# Patient Record
Sex: Male | Born: 1980
Health system: Southern US, Community
[De-identification: ages and names within clinical notes are randomized; demographics above are authoritative.]

---

## 2004-03-14 ENCOUNTER — Emergency Department (HOSPITAL_COMMUNITY): Admission: EM | Admit: 2004-03-14 | Discharge: 2004-03-14 | Payer: Self-pay

## 2004-12-21 ENCOUNTER — Emergency Department (HOSPITAL_COMMUNITY): Admission: EM | Admit: 2004-12-21 | Discharge: 2004-12-21 | Payer: Self-pay | Admitting: Emergency Medicine

## 2005-09-16 ENCOUNTER — Emergency Department (HOSPITAL_COMMUNITY): Admission: EM | Admit: 2005-09-16 | Discharge: 2005-09-16 | Payer: Self-pay | Admitting: Emergency Medicine

## 2006-07-02 IMAGING — CR DG CHEST 2V
2 series · 2 of 2 positions shown · non-contrast
Comparison: None.

CLINICAL DATA: Cough.  Fever.  Flu-like symptoms.
 CHEST ? 2 VIEW:

[w chest pa]
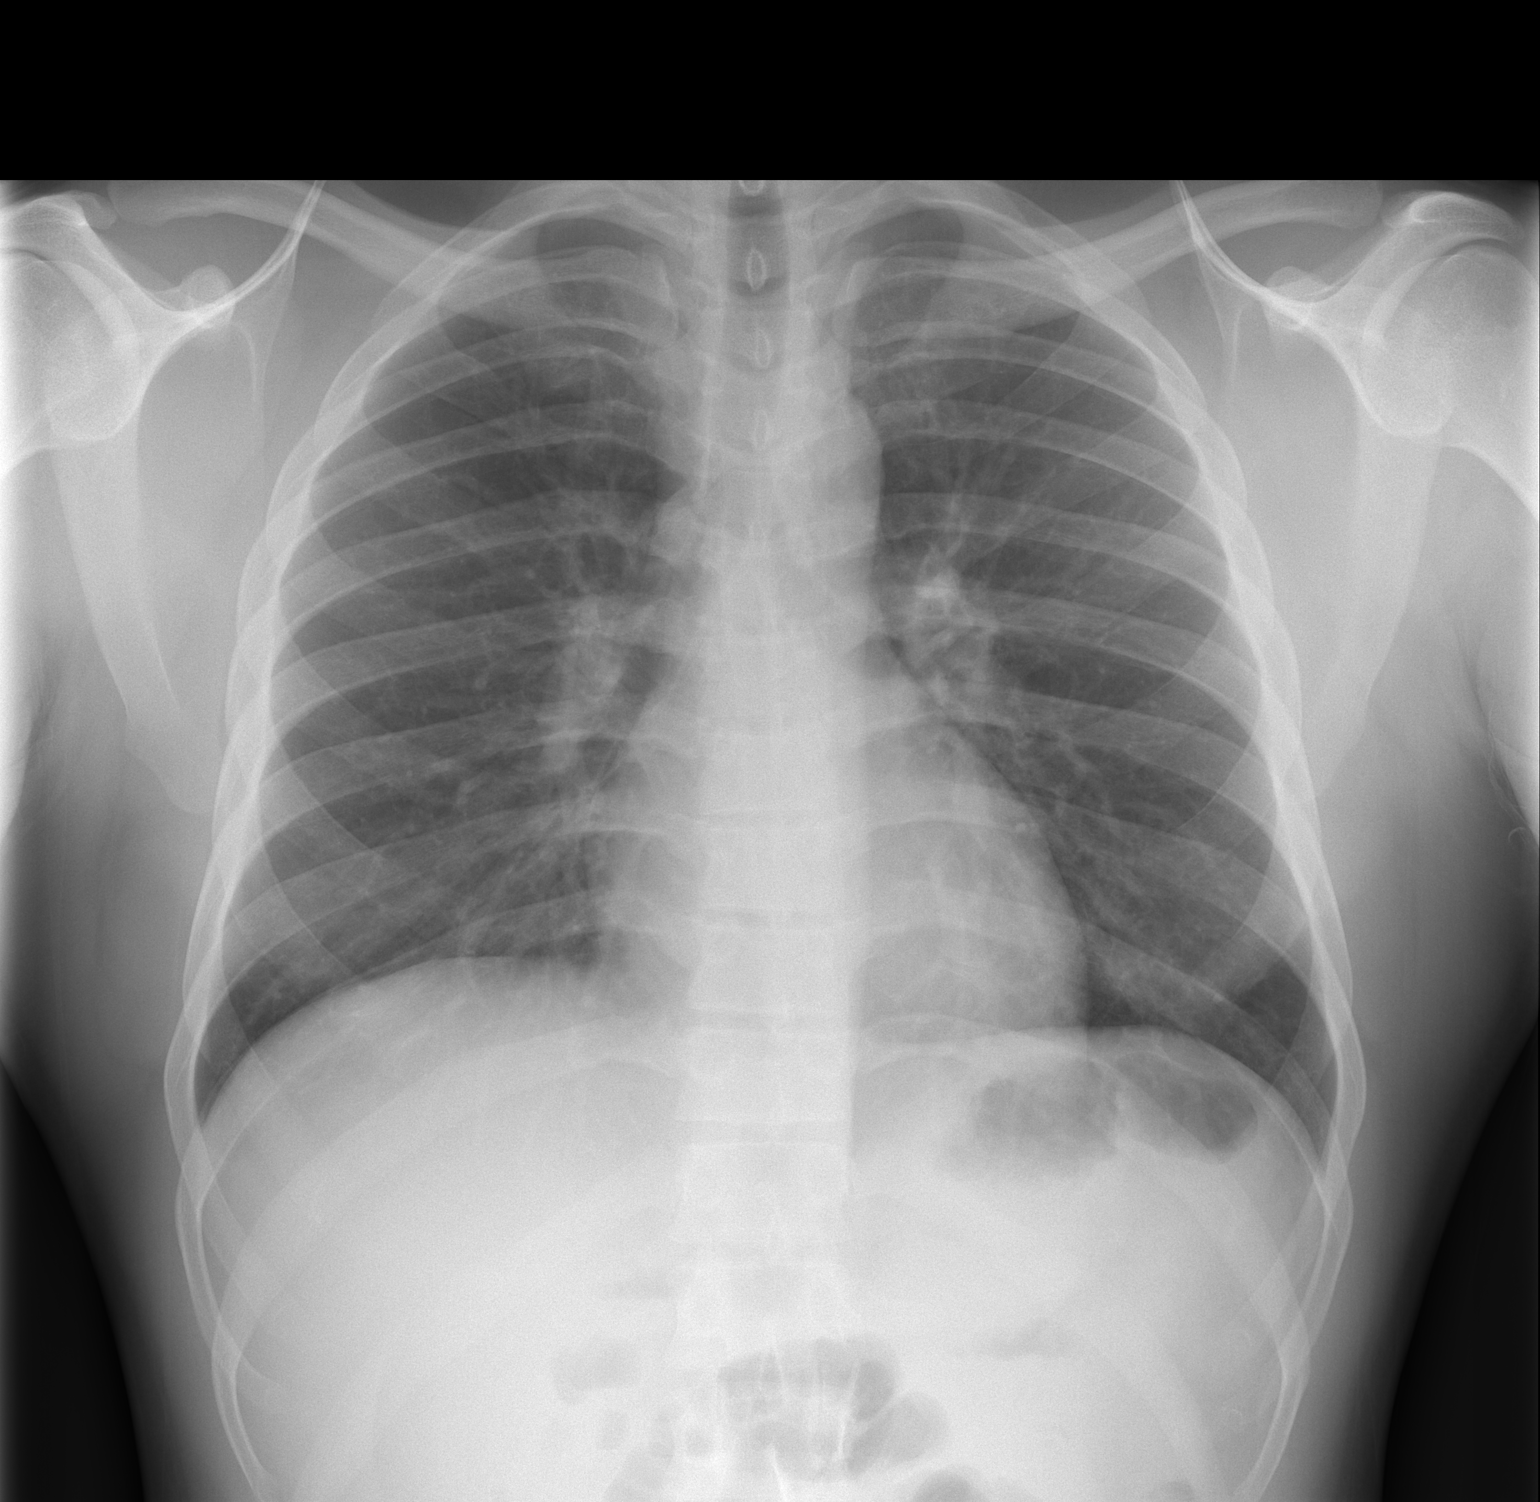

[w chest lat]
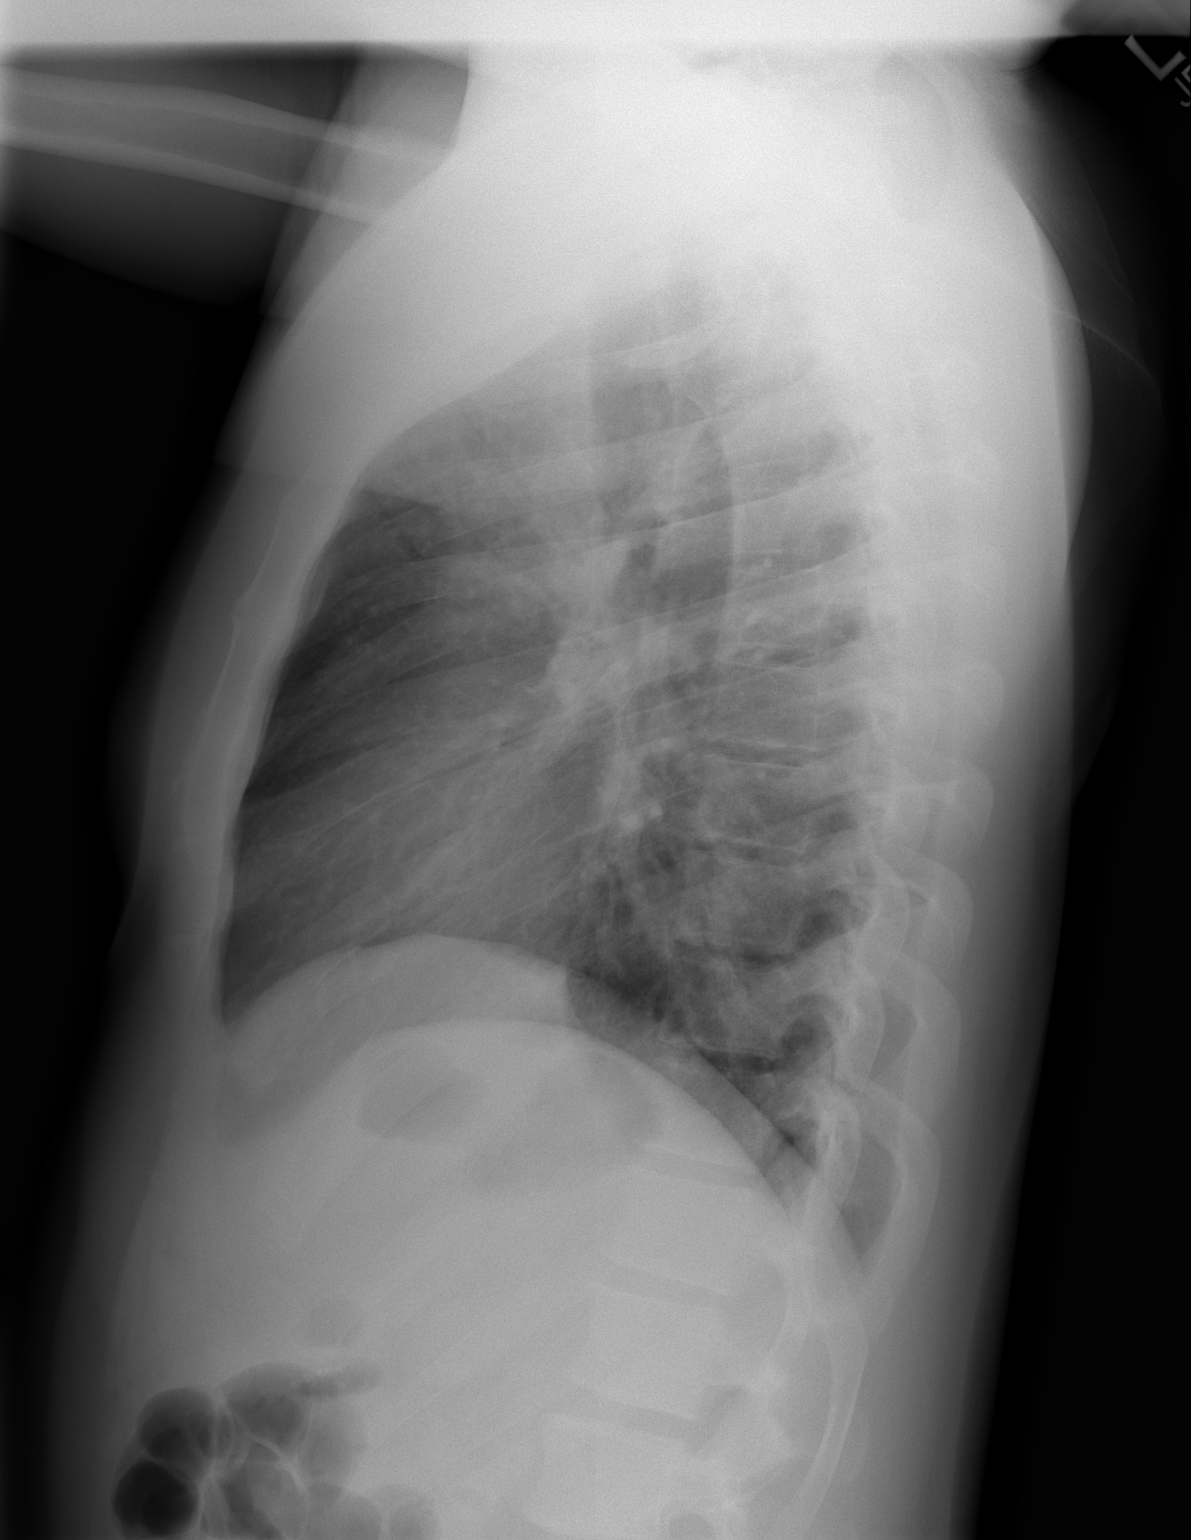

[2 of 2 positions shown; findings below may reference images not displayed]

FINDINGS: Mild patchy density is seen at the left base compatible with early airspace disease versus atelectasis.  Lungs are otherwise clear.  Mild central bronchitic changes are seen.  The heart is normal in size.  No pneumothoraces or effusions are seen.
IMPRESSION: Bronchitic changes.  Mild patchy atelectasis versus airspace disease left base.

## 2007-04-05 ENCOUNTER — Emergency Department (HOSPITAL_COMMUNITY): Admission: EM | Admit: 2007-04-05 | Discharge: 2007-04-05 | Payer: Self-pay | Admitting: Emergency Medicine

## 2007-06-17 ENCOUNTER — Emergency Department (HOSPITAL_COMMUNITY): Admission: EM | Admit: 2007-06-17 | Discharge: 2007-06-17 | Payer: Self-pay | Admitting: Family Medicine

## 2008-07-15 ENCOUNTER — Emergency Department (HOSPITAL_COMMUNITY): Admission: EM | Admit: 2008-07-15 | Discharge: 2008-07-15 | Payer: Self-pay | Admitting: Emergency Medicine

## 2009-01-11 ENCOUNTER — Emergency Department (HOSPITAL_COMMUNITY): Admission: EM | Admit: 2009-01-11 | Discharge: 2009-01-11 | Payer: Self-pay | Admitting: Emergency Medicine

## 2009-04-30 IMAGING — CR DG CHEST 2V
2 series · 2 of 2 positions shown · non-contrast
Comparison: PA and lateral chest 09/16/2005.

CLINICAL DATA: Sore throat and cough.

CHEST - 2 VIEW

[w chest pa]
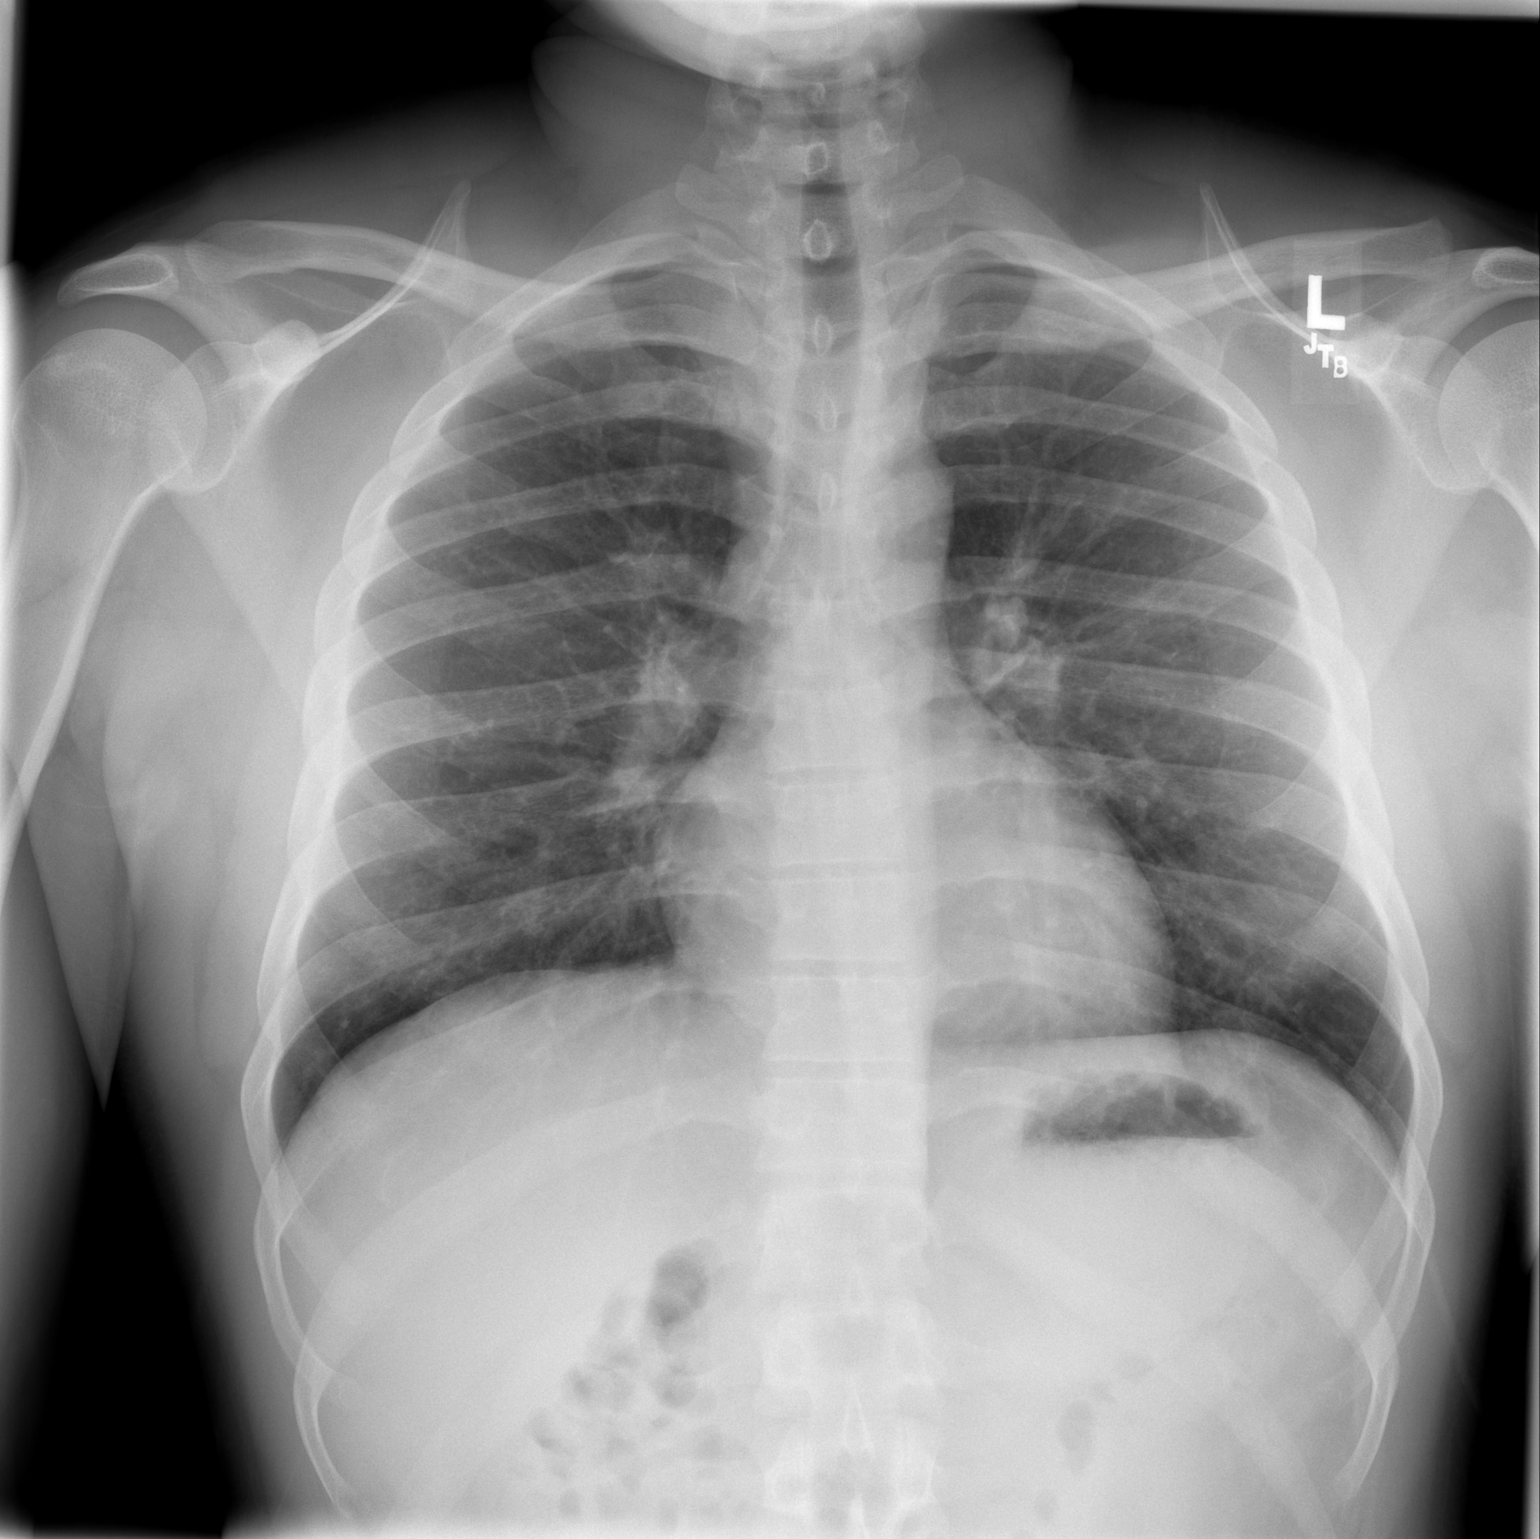

[w chest lat]
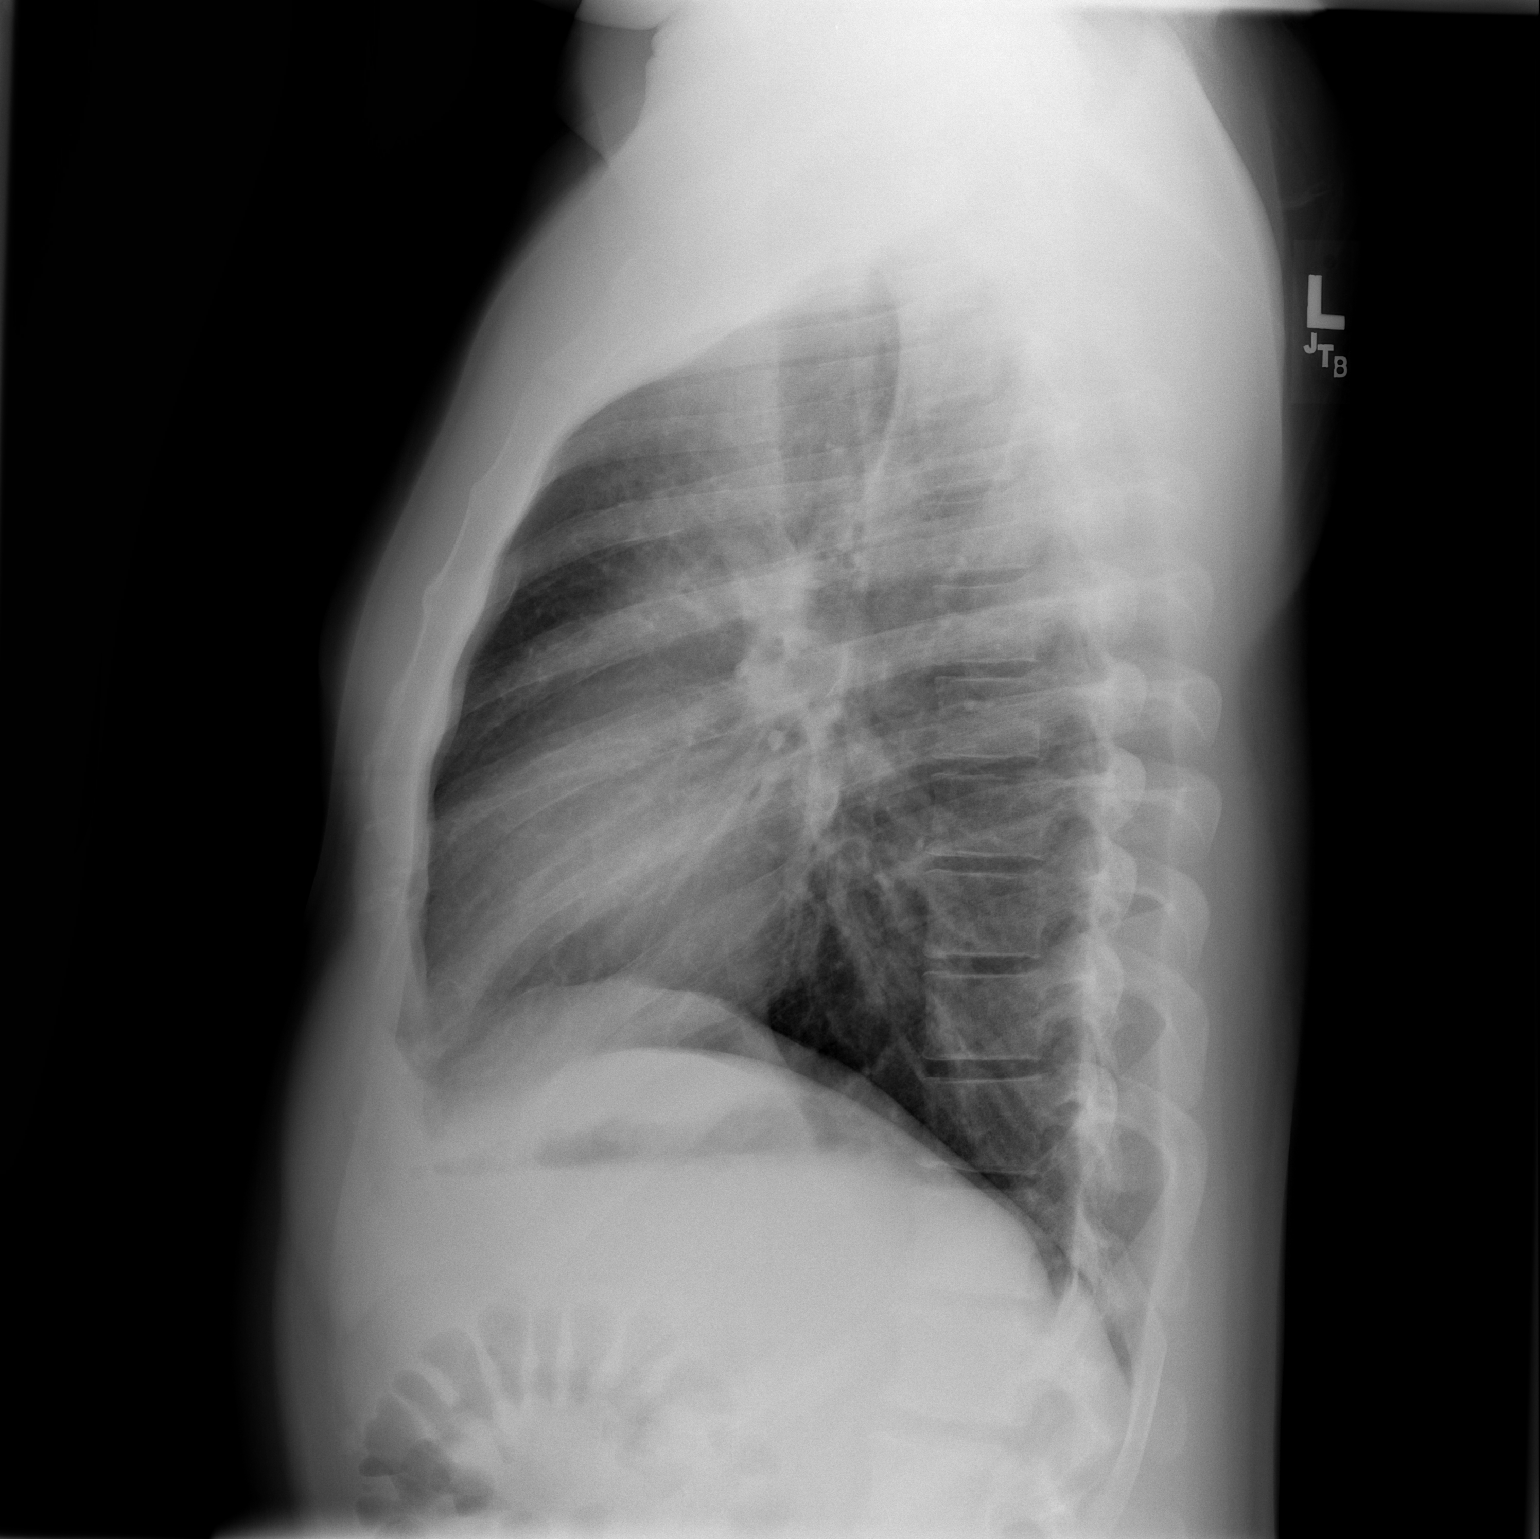

[2 of 2 positions shown; findings below may reference images not displayed]

FINDINGS: The lungs are clear.  Heart size is normal.  No pleural
effusion or focal bony abnormality.
IMPRESSION: No acute disease.

## 2013-06-08 ENCOUNTER — Emergency Department (INDEPENDENT_AMBULATORY_CARE_PROVIDER_SITE_OTHER)
Admission: EM | Admit: 2013-06-08 | Discharge: 2013-06-08 | Disposition: A | Discharge status: Home / Self Care | Payer: Self-pay | Source: Home / Self Care | Attending: Family Medicine | Admitting: Family Medicine

## 2013-06-08 ENCOUNTER — Encounter (HOSPITAL_COMMUNITY): Payer: Self-pay | Admitting: Emergency Medicine

## 2013-06-08 DIAGNOSIS — J069 Acute upper respiratory infection, unspecified: Secondary | ICD-10-CM

## 2013-06-08 MED ORDER — AZITHROMYCIN 250 MG PO TABS: 250.0000 mg | ORAL_TABLET | Freq: Every day | ORAL | Status: DC

## 2013-06-08 MED ORDER — IPRATROPIUM BROMIDE 0.03 % NA SOLN: 2.0000 | Freq: Two times a day (BID) | NASAL | Status: DC

## 2013-06-08 MED ORDER — HYDROCODONE-ACETAMINOPHEN 5-325 MG PO TABS: 0.5000 | ORAL_TABLET | Freq: Every evening | ORAL | Status: DC | PRN

## 2013-06-08 NOTE — ED Provider Notes (Signed)
Roberto Hunt. Ticas is a 32 y.o. male who presents to Urgent Care today for sore throat nasal discharge cough and congestion present for 2 days. Patient has tried some over-the-counter medications which have not helped much. No nausea vomiting or diarrhea chest pains palpitations or trouble breathing. He feels well otherwise.   History reviewed. No pertinent past medical history. History  Substance Use Topics  . Smoking status: Current Every Day Smoker  . Smokeless tobacco: Not on file  . Alcohol Use: Yes   ROS as above Medications reviewed. No current facility-administered medications for this encounter.   Current Outpatient Prescriptions  Medication Sig Dispense Refill  . azithromycin (ZITHROMAX) 250 MG tablet Take 1 tablet (250 mg total) by mouth daily. Take first 2 tablets together, then 1 every day until finished.  6 tablet  0  . HYDROcodone-acetaminophen (NORCO/VICODIN) 5-325 MG per tablet Take 0.5 tablets by mouth at bedtime as needed for pain (cough).  6 tablet  0  . ipratropium (ATROVENT) 0.03 % nasal spray Place 2 sprays into the nose every 12 (twelve) hours.  30 mL  1    Exam:  BP 140/83  Pulse 73  Temp(Src) 98.2 F (36.8 C) (Oral)  Resp 18  SpO2 100% Gen: Well NAD HEENT: EOMI,  MMM, clear nasal discharge. Tympanic membranes are normal appearing bilaterally. Posterior pharynx with cobblestoning. Lungs: CTABL Nl WOB Heart: RRR no MRG Abd: NABS, NT, ND Exts: Non edematous BL  LE, warm and well perfused.   Results for orders placed during the hospital encounter of 06/08/13 (from the past 24 hour(s))  POCT RAPID STREP A ([facility] ONLY)     Status: None   Collection Time    06/08/13  8:42 AM      Result Value Range   Streptococcus, Group A Screen (Direct) NEGATIVE  NEGATIVE   No results found.  Assessment and Plan: 32 y.o. male with viral URI with cough. Additionally patient has some postnasal drip. Plan to treat symptomatically with over-the-counter pain  medications. Additionally use Norco for cough, and Atrovent nasal spray for postnasal drip. Additional prescribe azithromycin if patient does not improve in several days. Followup as needed. Work note provided. Discussed warning signs or symptoms. Please see discharge instructions. Patient expresses understanding.      Rodolph Bong, MD 06/08/13 [phone]

## 2013-06-08 NOTE — ED Notes (Signed)
Pt  Reports  Symptoms  Of  Cough  /  Congested           Runny  Nose  And  A   sorethroat  With  The  Onset   Of  Symptoms  4  Days  Ago

## 2013-06-10 LAB — CULTURE, GROUP A STREP

## 2013-06-11 NOTE — ED Notes (Signed)
Lab review

## 2013-12-22 ENCOUNTER — Encounter (HOSPITAL_COMMUNITY): Payer: Self-pay | Admitting: Emergency Medicine

## 2013-12-22 ENCOUNTER — Emergency Department (HOSPITAL_COMMUNITY)
Admission: EM | Admit: 2013-12-22 | Discharge: 2013-12-23 | Disposition: A | Discharge status: Home / Self Care | Payer: BC Managed Care – PPO | Attending: Emergency Medicine | Admitting: Emergency Medicine

## 2013-12-22 DIAGNOSIS — R42 Dizziness and giddiness: Secondary | ICD-10-CM | POA: Insufficient documentation

## 2013-12-22 DIAGNOSIS — F17200 Nicotine dependence, unspecified, uncomplicated: Secondary | ICD-10-CM | POA: Insufficient documentation

## 2013-12-22 DIAGNOSIS — Z79899 Other long term (current) drug therapy: Secondary | ICD-10-CM | POA: Insufficient documentation

## 2013-12-22 DIAGNOSIS — R55 Syncope and collapse: Secondary | ICD-10-CM

## 2013-12-22 LAB — I-STAT CHEM 8, ED
BUN: 14 mg/dL (ref 6–23)
CALCIUM ION: 1.18 mmol/L (ref 1.12–1.23)
CHLORIDE: 102 meq/L (ref 96–112)
Creatinine, Ser: 1.1 mg/dL (ref 0.50–1.35)
GLUCOSE: 88 mg/dL (ref 70–99)
HEMATOCRIT: 46 % (ref 39.0–52.0)
Hemoglobin: 15.6 g/dL (ref 13.0–17.0)
POTASSIUM: 3.6 meq/L — AB (ref 3.7–5.3)
SODIUM: 140 meq/L (ref 137–147)
TCO2: 27 mmol/L (ref 0–100)

## 2013-12-22 LAB — CBC WITH DIFFERENTIAL/PLATELET
BASOS ABS: 0 10*3/uL (ref 0.0–0.1)
Basophils Relative: 0 % (ref 0–1)
EOS PCT: 2 % (ref 0–5)
Eosinophils Absolute: 0.2 10*3/uL (ref 0.0–0.7)
HCT: 43.1 % (ref 39.0–52.0)
HEMOGLOBIN: 14.1 g/dL (ref 13.0–17.0)
LYMPHS PCT: 22 % (ref 12–46)
Lymphs Abs: 2 10*3/uL (ref 0.7–4.0)
MCH: 28.3 pg (ref 26.0–34.0)
MCHC: 32.7 g/dL (ref 30.0–36.0)
MCV: 86.5 fL (ref 78.0–100.0)
MONO ABS: 0.9 10*3/uL (ref 0.1–1.0)
MONOS PCT: 11 % (ref 3–12)
NEUTROS ABS: 5.8 10*3/uL (ref 1.7–7.7)
Neutrophils Relative %: 65 % (ref 43–77)
Platelets: 218 10*3/uL (ref 150–400)
RBC: 4.98 MIL/uL (ref 4.22–5.81)
RDW: 13 % (ref 11.5–15.5)
WBC: 9 10*3/uL (ref 4.0–10.5)

## 2013-12-22 MED ORDER — SODIUM CHLORIDE 0.9 % IV BOLUS (SEPSIS)
1000.0000 mL | Freq: Once | INTRAVENOUS | Status: AC
  Administered 2013-12-22: 1000 mL via INTRAVENOUS

## 2013-12-22 NOTE — ED Notes (Signed)
Bed: WA22 Expected date:  Expected time:  Means of arrival:  Comments: EMS-syncopal episode 

## 2013-12-22 NOTE — ED Provider Notes (Signed)
Patient had syncopal event tonight 8:45 PM approximately 2.5 hours after having donated plasma. He felt lightheaded immediately prior to syncopal event. He suffered loss of con/proximally 45 seconds according to his wife. He is asymptomatic since treatment in the emergency department with intravenous fluids. On exam no distress lungs clear auscultation heart regular rate and rhythm murmurs. He is not lightheaded on standing gait normal at 11:50 PM patient feels well and ready to go home.  Date: 12/22/2013  Rate: 70  Rhythm: normal sinus rhythm  QRS Axis: normal  Intervals: normal  ST/T Wave abnormalities: normal  Conduction Disutrbances: none  Narrative Interpretation: unremarkable Spent 3 minutes councilling pt on smoking cessation   Syncope likely  Due to hypovolemia  Doug SouSam Sabree Nuon, MD 12/23/13 0000

## 2013-12-22 NOTE — ED Provider Notes (Signed)
CSN: [id number]     Arrival date & time 12/22/13  2154 History   First MD Initiated Contact with Patient 12/22/13 2217     Chief Complaint  Patient presents with  . Loss of Consciousness     (Consider location/radiation/quality/duration/timing/severity/associated sxs/prior Treatment) HPI Rhona RaiderJacob J. Manson PasseyBrown is a 33 y.o. male who presents to ED with complaint of syncopal episode. Pt states he donated plasma this morning. States ate two hamburgers, however has not had much fluids since then. States was sitting down for dinner when suddenly became light headded, dizzy, states tried to get up to go lie down but had a syncopal episode. It was witnessed. No injuries reported. Syncopal episode lasted few seconds. No followed confusion, bladder incontinence. Pt states when he came to it, he was still dizzy and when tried to get up, got light headed again and felt like he was going to faint again. Pt was then moved to the couch. EMS was called and he was brought here. Pt denies any current complaints. He denies ever having any chest pain. Denies any headache. No palpitations. No cardiac history.   History reviewed. No pertinent past medical history. History reviewed. No pertinent past surgical history. No family history on file. History  Substance Use Topics  . Smoking status: Current Every Day Smoker  . Smokeless tobacco: Not on file  . Alcohol Use: Yes    Review of Systems  Constitutional: Negative for fever and chills.  HENT: Negative for congestion.   Eyes: Negative for photophobia and visual disturbance.  Respiratory: Negative for cough, chest tightness and shortness of breath.   Cardiovascular: Negative for chest pain, palpitations and leg swelling.  Gastrointestinal: Negative for nausea, vomiting, abdominal pain, diarrhea and abdominal distention.  Genitourinary: Negative for dysuria, urgency, frequency and hematuria.  Musculoskeletal: Negative for arthralgias, myalgias, neck pain and neck  stiffness.  Skin: Negative for rash.  Allergic/Immunologic: Negative for immunocompromised state.  Neurological: Positive for dizziness, syncope and light-headedness. Negative for seizures, speech difficulty, weakness, numbness and headaches.      Allergies  Review of patient's allergies indicates no known allergies.  Home Medications   Prior to Admission medications   Medication Sig Start Date End Date Taking? Authorizing Provider  azithromycin (ZITHROMAX) 250 MG tablet Take 1 tablet (250 mg total) by mouth daily. Take first 2 tablets together, then 1 every day until finished. 06/08/13   Rodolph BongEvan S Corey, MD  HYDROcodone-acetaminophen (NORCO/VICODIN) 5-325 MG per tablet Take 0.5 tablets by mouth at bedtime as needed for pain (cough). 06/08/13   Rodolph BongEvan S Corey, MD  ipratropium (ATROVENT) 0.03 % nasal spray Place 2 sprays into the nose every 12 (twelve) hours. 06/08/13   Rodolph BongEvan S Corey, MD   BP 116/72  Pulse 87  Temp(Src) 98.1 F (36.7 C) (Oral)  Resp 16  SpO2 99% Physical Exam  Nursing note and vitals reviewed. Constitutional: He appears well-developed and well-nourished. No distress.  HENT:  Head: Normocephalic and atraumatic.  Eyes: Conjunctivae are normal.  Neck: Neck supple.  Cardiovascular: Normal rate, regular rhythm and normal heart sounds.   Pulmonary/Chest: Effort normal. No respiratory distress. He has no wheezes. He has no rales.  Abdominal: Soft. Bowel sounds are normal. He exhibits no distension. There is no tenderness. There is no rebound.  Musculoskeletal: He exhibits no edema.  Neurological: He is alert.  5/5 and equal upper and lower extremity strength bilaterally. Equal grip strength bilaterally. Normal finger to nose and heel to shin. No pronator drift. Normal gait  Skin: Skin is warm and dry.    ED Course  Procedures (including critical care time) Labs Review Labs Reviewed  I-STAT CHEM 8, ED - Abnormal; Notable for the following:    Potassium 3.6 (*)    All  other components within normal limits  CBC WITH DIFFERENTIAL    Imaging Review No results found.   EKG Interpretation None      MDM   Final diagnoses:  Syncope    Pt with LOC tonight after becoming dizzy sitting up at the table and trying to get up. Pt has mild elevation in HR upon standing here in ED, BP remains stable. He already received 1L of NS. His VS are normal. He denies any complaints. He is asymptomatic. I suspect his syncope most likely due to hypovolemia. He is otherwise healthy with no cardiac problems. Will get CBC, electrolytes, and administer more IV fluids.   12:10 AM Labs uremarkable. Pt also seen by Dr. Ethelda ChickJacubowitz. Agrees with my assessment. Pt feeling normal, no complaints. ECG normal. Pt is able to ambulate here in ED with no dizziness. Home with fluids PO, follow up with PCP.   Filed Vitals:   12/22/13 2242 12/22/13 2245 12/22/13 2252 12/22/13 2357  BP: 107/57 102/69 116/72 117/78  Pulse: 65 73 87 70  Temp:      TempSrc:      Resp:    20  SpO2:    100%         Abdulrahman Bracey A Emira Eubanks, PA-C 12/23/13 0019

## 2013-12-22 NOTE — ED Notes (Signed)
Per EMS, pt had lost consciousness for ~30 seconds while he was eating dinner at 9PM. Pt states he gave plasma at 7PM today for the first time since October. Pt states he had not eaten or had anything to drink since giving plasma today. Pt A&Ox4

## 2013-12-23 NOTE — ED Provider Notes (Signed)
Medical screening examination/treatment/procedure(s) were performed by non-physician practitioner and as supervising physician I was immediately available for consultation/collaboration.   EKG Interpretation None        Lyanne CoKevin M Danel Studzinski, MD 12/23/13 [phone]

## 2013-12-23 NOTE — Discharge Instructions (Signed)
Make sure to drink plenty of fluids. Eat well. Follow up with your doctor. Stop smoking. Return if worsening.    Syncope Syncope is a fainting spell. This means the person loses consciousness and drops to the ground. The person is generally unconscious for less than 5 minutes. The person may have some muscle twitches for up to 15 seconds before waking up and returning to normal. Syncope occurs more often in elderly people, but it can happen to anyone. While most causes of syncope are not dangerous, syncope can be a sign of a serious medical problem. It is important to seek medical care.  CAUSES  Syncope is caused by a sudden decrease in blood flow to the brain. The specific cause is often not determined. Factors that can trigger syncope include:  Taking medicines that lower blood pressure.  Sudden changes in posture, such as standing up suddenly.  Taking more medicine than prescribed.  Standing in one place for too long.  Seizure disorders.  Dehydration and excessive exposure to heat.  Low blood sugar (hypoglycemia).  Straining to have a bowel movement.  Heart disease, irregular heartbeat, or other circulatory problems.  Fear, emotional distress, seeing blood, or severe pain. SYMPTOMS  Right before fainting, you may:  Feel dizzy or lightheaded.  Feel nauseous.  See all white or all black in your field of vision.  Have cold, clammy skin. DIAGNOSIS  Your caregiver will ask about your symptoms, perform a physical exam, and perform electrocardiography (ECG) to record the electrical activity of your heart. Your caregiver may also perform other heart or blood tests to determine the cause of your syncope. TREATMENT  In most cases, no treatment is needed. Depending on the cause of your syncope, your caregiver may recommend changing or stopping some of your medicines. HOME CARE INSTRUCTIONS  Have someone stay with you until you feel stable.  Do not drive, operate machinery, or  play sports until your caregiver says it is okay.  Keep all follow-up appointments as directed by your caregiver.  Lie down right away if you start feeling like you might faint. Breathe deeply and steadily. Wait until all the symptoms have passed.  Drink enough fluids to keep your urine clear or pale yellow.  If you are taking blood pressure or heart medicine, get up slowly, taking several minutes to sit and then stand. This can reduce dizziness. SEEK IMMEDIATE MEDICAL CARE IF:   You have a severe headache.  You have unusual pain in the chest, abdomen, or back.  You are bleeding from the mouth or rectum, or you have black or tarry stool.  You have an irregular or very fast heartbeat.  You have pain with breathing.  You have repeated fainting or seizure-like jerking during an episode.  You faint when sitting or lying down.  You have confusion.  You have difficulty walking.  You have severe weakness.  You have vision problems. If you fainted, call your local emergency services (911 in [place] Do not drive yourself to the hospital.  MAKE SURE YOU:  Understand these instructions.  Will watch your condition.  Will get help right away if you are not doing well or get worse. Document Released: 07/29/2005 Document Revised: 01/28/2012 Document Reviewed: 09/27/2011 [facility] Patient Information 2014 [employer], [place].

## 2019-03-17 ENCOUNTER — Other Ambulatory Visit: Payer: Self-pay | Admitting: Family Medicine

## 2019-03-17 DIAGNOSIS — Z20822 Contact with and (suspected) exposure to covid-19: Secondary | ICD-10-CM

## 2019-03-18 ENCOUNTER — Other Ambulatory Visit: Payer: Self-pay

## 2019-03-18 DIAGNOSIS — Z20822 Contact with and (suspected) exposure to covid-19: Secondary | ICD-10-CM

## 2019-03-19 LAB — SPECIMEN STATUS REPORT

## 2019-03-19 LAB — NOVEL CORONAVIRUS, NAA: SARS-CoV-2, NAA: NOT DETECTED

## 2019-07-09 ENCOUNTER — Encounter: Payer: Self-pay | Admitting: Emergency Medicine

## 2019-07-09 ENCOUNTER — Other Ambulatory Visit: Payer: Self-pay

## 2019-07-09 ENCOUNTER — Ambulatory Visit
Admission: EM | Admit: 2019-07-09 | Discharge: 2019-07-09 | Disposition: A | Discharge status: Home / Self Care | Payer: 59 | Attending: Emergency Medicine | Admitting: Emergency Medicine

## 2019-07-09 DIAGNOSIS — Z20828 Contact with and (suspected) exposure to other viral communicable diseases: Secondary | ICD-10-CM

## 2019-07-09 DIAGNOSIS — R5383 Other fatigue: Secondary | ICD-10-CM

## 2019-07-09 NOTE — Discharge Instructions (Addendum)
Your COVID test is pending - it is important to quarantine / isolate at home until your results are back. °If you test positive and would like further evaluation for persistent or worsening symptoms, you may schedule an E-visit or virtual (video) visit throughout the [facility] MyChart app or website. ° °PLEASE NOTE: If you develop severe chest pain or shortness of breath please go to the ER or call 9-1-1 for further evaluation --> DO NOT schedule electronic or virtual visits for this. °Please call our office for further guidance / recommendations as needed. °

## 2019-07-09 NOTE — ED Provider Notes (Signed)
[name]    CSN: [id number] Arrival date & time: 07/09/19  4166      History   Chief Complaint Chief Complaint  Patient presents with  . Fatigue    HPI Roberto Hunt is a 38 y.o. male with history of allergies presenting for fatigue, intermittent fever since this morning.  Patient denies myalgias, known sick contacts.  Has not taken anything for his symptoms.  States his temperature has varied between 101.107F, 90 9.72F at home.  Has not taken allergy medications the last 3 days.  Patient denies history of anemia, cardiopulmonary disease.  No other associated symptoms and reports good oral intake.   History reviewed. No pertinent past medical history.  There are no active problems to display for this patient.   History reviewed. No pertinent surgical history.     Home Medications    Prior to Admission medications   Medication Sig Start Date End Date Taking? Authorizing Provider  montelukast (SINGULAIR) 10 MG tablet Take 10 mg by mouth at bedtime.   Yes [provider]  OVER THE COUNTER MEDICATION Take 1 tablet by mouth once. [employer] brand supplement for energy    [provider]    Family History Family History  Problem Relation Age of Onset  . Healthy Mother   . Hypertension Father     Social History Social History   Tobacco Use  . Smoking status: Current Every Day Smoker    Packs/day: 0.10  . Smokeless tobacco: Never Used  Substance Use Topics  . Alcohol use: Yes  . Drug use: Never     Allergies   Patient has no known allergies.   Review of Systems Review of Systems  Constitutional: Positive for fatigue and fever.  Respiratory: Negative for cough and shortness of breath.   Cardiovascular: Negative for chest pain and palpitations.  Gastrointestinal: Negative for abdominal pain, diarrhea and vomiting.  Musculoskeletal: Negative for arthralgias and myalgias.  Skin: Negative for rash and wound.  Neurological: Negative  for speech difficulty and headaches.  All other systems reviewed and are negative.    Physical Exam Triage Vital Signs ED Triage Vitals  Enc Vitals Group     BP      Pulse      Resp      Temp      Temp src      SpO2      Weight      Height      Head Circumference      Peak Flow      Pain Score      Pain Loc      Pain Edu?      Excl. in [place]?    No data found.  Updated Vital Signs BP (!) 149/101 (BP Location: Left Arm)   Pulse 96   Temp 99.5 F (37.5 C) (Temporal)   Resp 18   SpO2 97%   Visual Acuity Right Eye Distance:   Left Eye Distance:   Bilateral Distance:    Right Eye Near:   Left Eye Near:    Bilateral Near:     Physical Exam Constitutional:      General: He is not in acute distress.    Appearance: He is obese. He is not ill-appearing.  HENT:     Head: Normocephalic and atraumatic.     Mouth/Throat:     Mouth: Mucous membranes are moist.     Pharynx: Oropharynx is clear.  Eyes:  General: No scleral icterus.    Pupils: Pupils are equal, round, and reactive to light.  Cardiovascular:     Rate and Rhythm: Normal rate and regular rhythm.  Pulmonary:     Effort: Pulmonary effort is normal. No respiratory distress.     Breath sounds: No wheezing.  Musculoskeletal: Normal range of motion.        General: No tenderness.     Right lower leg: No edema.     Left lower leg: No edema.  Skin:    Capillary Refill: Capillary refill takes less than 2 seconds.     Coloration: Skin is not jaundiced or pale.  Neurological:     General: No focal deficit present.     Mental Status: He is alert and oriented to person, place, and time.      UC Treatments / Results  Labs (all labs ordered are listed, but only abnormal results are displayed) Labs Reviewed  NOVEL CORONAVIRUS, NAA    EKG   Radiology No results found.  Procedures Procedures (including critical care time)  Medications Ordered in UC Medications - No data to display  Initial  Impression / Assessment and Plan / UC Course  I have reviewed the triage vital signs and the nursing notes.  Pertinent labs & imaging results that were available during my care of the patient were reviewed by me and considered in my medical decision making (see chart for details).     Covid PCR pending at patient's request.  Patient without significant risk factors, no reported blood loss/active bleeding or orthostasis.  Patient afebrile, nontoxic.  Will quarantine until results are back, treat supportively as reviewed at time of appointment.  Return precautions discussed, patient verbalized understanding and is agreeable to plan. Final Clinical Impressions(s) / UC Diagnoses   Final diagnoses:  Fatigue, unspecified type     Discharge Instructions     Your COVID test is pending - it is important to quarantine / isolate at home until your results are back. If you test positive and would like further evaluation for persistent or worsening symptoms, you may schedule an E-visit or virtual (video) visit throughout the [facility] app or website.  PLEASE NOTE: If you develop severe chest pain or shortness of breath please go to the ER or call 9-1-1 for further evaluation --> DO NOT schedule electronic or virtual visits for this. Please call our office for further guidance / recommendations as needed.    ED Prescriptions    None     PDMP not reviewed this encounter.   [place], [place], [place] 07/09/19 1124

## 2019-07-09 NOTE — ED Triage Notes (Signed)
Pt presents to [facility] for assessment after waking up to fatigue this morning.  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  Denies any symptoms other than fatigue and fever.  99.5 temporal here I office.

## 2019-07-09 NOTE — ED Notes (Signed)
Patient able to ambulate independently  

## 2019-07-11 LAB — NOVEL CORONAVIRUS, NAA: SARS-CoV-2, NAA: DETECTED — AB

## 2019-07-12 ENCOUNTER — Telehealth (HOSPITAL_COMMUNITY): Payer: Self-pay | Admitting: Emergency Medicine

## 2019-07-12 NOTE — Telephone Encounter (Signed)
Positive covid detected on sample Patient contacted by phone and made aware of    Results. Pt quarantine time will end on dec 8th as long as no fever and symptoms improved. Work notes sent to [employer]. Pt states his family is getting tested, pt instructed that they may still need to stay quarantined due to long incubation period of covid. Pt verbalized understanding and had all questions answered.

## 2020-08-07 ENCOUNTER — Other Ambulatory Visit: Payer: Self-pay

## 2020-08-07 ENCOUNTER — Ambulatory Visit
Admission: EM | Admit: 2020-08-07 | Discharge: 2020-08-07 | Disposition: A | Discharge status: Home / Self Care | Payer: 59 | Attending: Emergency Medicine | Admitting: Emergency Medicine

## 2020-08-07 DIAGNOSIS — R197 Diarrhea, unspecified: Secondary | ICD-10-CM

## 2020-08-07 DIAGNOSIS — J069 Acute upper respiratory infection, unspecified: Secondary | ICD-10-CM

## 2020-08-07 MED ORDER — MONTELUKAST SODIUM 10 MG PO TABS: 10.0000 mg | ORAL_TABLET | Freq: Every day | ORAL | 0 refills | Status: AC

## 2020-08-07 MED ORDER — FLUTICASONE PROPIONATE 50 MCG/ACT NA SUSP: 1.0000 | Freq: Every day | NASAL | 0 refills | Status: AC

## 2020-08-07 MED ORDER — AEROCHAMBER PLUS FLO-VU MEDIUM MISC: 1.0000 | Freq: Once | 0 refills | Status: AC

## 2020-08-07 MED ORDER — ALBUTEROL SULFATE HFA 108 (90 BASE) MCG/ACT IN AERS: 2.0000 | INHALATION_SPRAY | Freq: Four times a day (QID) | RESPIRATORY_TRACT | 2 refills | Status: AC | PRN

## 2020-08-07 MED ORDER — BENZONATATE 100 MG PO CAPS: 100.0000 mg | ORAL_CAPSULE | Freq: Three times a day (TID) | ORAL | 0 refills | Status: AC

## 2020-08-07 NOTE — Discharge Instructions (Addendum)
CORICIDIN   Tessalon for cough. Start flonase, atrovent nasal spray for nasal congestion/drainage. You can use over the counter nasal saline rinse such as neti pot for nasal congestion. Keep hydrated, your urine should be clear to pale yellow in color. Tylenol/motrin for fever and pain. Monitor for any worsening of symptoms, chest pain, shortness of breath, wheezing, swelling of the throat, go to the emergency department for further evaluation needed.

## 2020-08-07 NOTE — ED Provider Notes (Signed)
[name]    CSN: [id number] Arrival date & time: 08/07/20  7106      History   Chief Complaint Chief Complaint  Patient presents with  . Nasal Congestion    Since yesterday    HPI Roberto Hunt. Ellner is a 39 y.o. male  History was provided by the patient. Roberto Hunt. Kuhlman is a 39 y.o. male who presents for evaluation of symptoms of a URI. Symptoms include dry cough, nasal blockage, post nasal drip, sinus and nasal congestion and sore throat. Onset of symptoms was 1 day ago, unchanged since that time. Associated symptoms include mild chest tightness; feels similar to previous episode of bronchitis, loose stools.  He is drinking plenty of fluids. Evaluation to date: none. Treatment to date: none The following portions of the patient's history were reviewed and updated as appropriate: allergies, current medications, past family history, past medical history, past social history, past surgical history and problem list.     History reviewed. No pertinent past medical history.  There are no problems to display for this patient.   History reviewed. No pertinent surgical history.     Home Medications    Prior to Admission medications   Medication Sig Start Date End Date Taking? Authorizing Provider  OVER THE COUNTER MEDICATION Take 1 tablet by mouth once. [employer] brand supplement for energy   Yes [provider]  albuterol (VENTOLIN HFA) 108 (90 Base) MCG/ACT inhaler Inhale 2 puffs into the lungs every 6 (six) hours as needed for wheezing or shortness of breath. 08/07/20   Hall-Potvin, [place], PA-C  benzonatate (TESSALON) 100 MG capsule Take 1 capsule (100 mg total) by mouth every 8 (eight) hours. 08/07/20   Hall-Potvin, [place], PA-C  fluticasone (FLONASE) 50 MCG/ACT nasal spray Place 1 spray into both nostrils daily. 08/07/20   Hall-Potvin, [place], PA-C  montelukast (SINGULAIR) 10 MG tablet Take 1 tablet (10 mg total) by mouth at bedtime. 08/07/20    Hall-Potvin, [place], PA-C  Spacer/Aero-Holding Chambers (AEROCHAMBER PLUS FLO-VU MEDIUM) MISC 1 each by Other route once for 1 dose. 08/07/20 08/07/20  Hall-Potvin, [place], PA-C    Family History Family History  Problem Relation Age of Onset  . Healthy Mother   . Hypertension Father     Social History Social History   Tobacco Use  . Smoking status: Current Every Day Smoker    Packs/day: 0.10  . Smokeless tobacco: Never Used  Vaping Use  . Vaping Use: Never used  Substance Use Topics  . Alcohol use: Yes  . Drug use: Never     Allergies   Patient has no known allergies.   Review of Systems Review of Systems  Constitutional: Negative for fatigue and fever.  HENT: Positive for congestion. Negative for dental problem, ear pain, facial swelling, hearing loss, sinus pain, sore throat, trouble swallowing and voice change.   Eyes: Negative for photophobia, pain and visual disturbance.  Respiratory: Positive for cough and chest tightness. Negative for shortness of breath and wheezing.   Cardiovascular: Negative for chest pain and palpitations.  Gastrointestinal: Positive for diarrhea. Negative for nausea and vomiting.  Musculoskeletal: Negative for arthralgias and myalgias.  Neurological: Negative for dizziness and headaches.     Physical Exam Triage Vital Signs ED Triage Vitals  Enc Vitals Group     BP 08/07/20 0834 (!) 150/108     Pulse Rate 08/07/20 0834 94     Resp 08/07/20 0834 20     Temp 08/07/20 0834 98.5 F (36.9 C)  Temp Source 08/07/20 0834 Oral     SpO2 08/07/20 0834 97 %     Weight --      Height --      Head Circumference --      Peak Flow --      Pain Score 08/07/20 0850 0     Pain Loc --      Pain Edu? --      Excl. in GC? --    No data found.  Updated Vital Signs BP (!) 150/108 (BP Location: Right Arm)   Pulse 94   Temp 98.5 F (36.9 C) (Oral)   Resp 20   SpO2 97%   Visual Acuity Right Eye Distance:   Left Eye Distance:    Bilateral Distance:    Right Eye Near:   Left Eye Near:    Bilateral Near:     Physical Exam Constitutional:      General: He is not in acute distress.    Appearance: He is not toxic-appearing or diaphoretic.  HENT:     Head: Normocephalic and atraumatic.     Right Ear: Tympanic membrane and ear canal normal.     Left Ear: Tympanic membrane and ear canal normal.     Mouth/Throat:     Mouth: Mucous membranes are moist.     Pharynx: Oropharynx is clear.  Eyes:     General: No scleral icterus.    Conjunctiva/sclera: Conjunctivae normal.     Pupils: Pupils are equal, round, and reactive to light.  Neck:     Comments: Trachea midline, negative JVD Cardiovascular:     Rate and Rhythm: Normal rate and regular rhythm.  Pulmonary:     Effort: Pulmonary effort is normal. No respiratory distress.     Breath sounds: No wheezing.  Musculoskeletal:     Cervical back: Neck supple. No tenderness.  Lymphadenopathy:     Cervical: No cervical adenopathy.  Skin:    Capillary Refill: Capillary refill takes less than 2 seconds.     Coloration: Skin is not jaundiced or pale.     Findings: No rash.  Neurological:     Mental Status: He is alert and oriented to person, place, and time.      UC Treatments / Results  Labs (all labs ordered are listed, but only abnormal results are displayed) Labs Reviewed  NOVEL CORONAVIRUS, NAA    EKG   Radiology No results found.  Procedures Procedures (including critical care time)  Medications Ordered in UC Medications - No data to display  Initial Impression / Assessment and Plan / UC Course  I have reviewed the triage vital signs and the nursing notes.  Pertinent labs & imaging results that were available during my care of the patient were reviewed by me and considered in my medical decision making (see chart for details).     Patient afebrile, nontoxic, with SpO2 97%.  Patient is hypertensive, though admits to eating poorly over the  weekend due to the holidays.  Covid PCR pending.  Patient to quarantine until results are back.  We will treat supportively as outlined below.  Return precautions discussed, patient verbalized understanding and is agreeable to plan. Final Clinical Impressions(s) / UC Diagnoses   Final diagnoses:  Diarrhea, unspecified type  URI with cough and congestion     Discharge Instructions     CORICIDIN   Tessalon for cough. Start flonase, atrovent nasal spray for nasal congestion/drainage. You can use over the counter nasal saline rinse such  as neti pot for nasal congestion. Keep hydrated, your urine should be clear to pale yellow in color. Tylenol/motrin for fever and pain. Monitor for any worsening of symptoms, chest pain, shortness of breath, wheezing, swelling of the throat, go to the emergency department for further evaluation needed.     ED Prescriptions    Medication Sig Dispense Auth. Provider   benzonatate (TESSALON) 100 MG capsule Take 1 capsule (100 mg total) by mouth every 8 (eight) hours. 21 capsule Hall-Potvin, [place], PA-C   montelukast (SINGULAIR) 10 MG tablet Take 1 tablet (10 mg total) by mouth at bedtime. 30 tablet Hall-Potvin, [place], PA-C   fluticasone (FLONASE) 50 MCG/ACT nasal spray Place 1 spray into both nostrils daily. 16 g Hall-Potvin, [place], PA-C   albuterol (VENTOLIN HFA) 108 (90 Base) MCG/ACT inhaler Inhale 2 puffs into the lungs every 6 (six) hours as needed for wheezing or shortness of breath. 8 g Hall-Potvin, [place], PA-C   Spacer/Aero-Holding Chambers (AEROCHAMBER PLUS FLO-VU MEDIUM) MISC 1 each by Other route once for 1 dose. 1 each Hall-Potvin, [place], PA-C     PDMP not reviewed this encounter.   Hall-Potvin, [place], [place] 08/07/20 [phone]

## 2020-08-07 NOTE — ED Triage Notes (Signed)
Patient states he had nasal congestion since yesterday. Pt also complains of frequent trips to the bathroom this am but states no diarrhea. Pt is aox4 and ambulatory.

## 2020-08-08 LAB — SARS-COV-2, NAA 2 DAY TAT

## 2020-08-08 LAB — NOVEL CORONAVIRUS, NAA: SARS-CoV-2, NAA: DETECTED — AB

## 2020-08-09 ENCOUNTER — Ambulatory Visit: Payer: Self-pay

## 2020-08-09 NOTE — Telephone Encounter (Signed)
Phone call to pt.  Stated his boss requested that he call to clarify how long he should quarantine after testing positive for COVID.  Pt. Reported he received written instructions in MyChart to quarantine for 10 days.  Advised pt. that the [facility] has been informed that the quarantine time frame has been updated by the CDC:  "If You Test Positive for COVID-19 Isolate Everyone, regardless of vaccination status.  Stay home for five days.  If you have no symptoms or your symptoms are resolving after five days, you can leave your  house.  Continue to wear a mask around others for five additional days. If you have a fever, continue to stay home until your fever resolves."  The patient stated since he rec'd instructions in MyChart that states to quarantine for 10 days, his boss wants him to clarify this discrepancy in number of days to isolate.  Advised pt that he should contact Florentina Addison, RN at [facility] @ [phone], for clarification on isolation guidelines.  Pt. Verb. Understanding; agreed to contact Urgent Care.      Reason for Disposition  [1] Caller requesting NON-URGENT health information AND [2] PCP's office is the best resource    Advised to contact nurse at Urgent Care re: the Quarantine guidelines that were sent via MyChart.  Encouraged to call Florentina Addison, RN @ [phone] at [facility].  Answer Assessment - Initial Assessment Questions 1. REASON FOR CALL or QUESTION: "What is your reason for calling today?" or "How can I best help you?" or "What question do you have that I can help answer?"     Pt. Is asking for quarantining guidelines with positive COVID test  Protocols used: INFORMATION ONLY CALL - NO TRIAGE-A-AH  Message from Gwenlyn Fudge sent at 08/09/2020 3:20 PM EST  Pt called stating that he tested positive for covid on 08/08/20. He states that he is not sure if he should be quarantining for 10 days or 5 days due to being fully vaccinated. Please advise.
